# Patient Record
Sex: Female | Born: 1935 | Race: White | Hispanic: No | Smoking: Never smoker
Health system: Southern US, Community
[De-identification: ages and names within clinical notes are randomized; demographics above are authoritative.]

## PROBLEM LIST (undated history)

## (undated) DIAGNOSIS — I10 Essential (primary) hypertension: Secondary | ICD-10-CM

## (undated) HISTORY — PX: BACK SURGERY: SHX140

---

## 1998-02-26 ENCOUNTER — Other Ambulatory Visit: Admission: RE | Admit: 1998-02-26 | Discharge: 1998-02-26 | Payer: Self-pay | Admitting: Internal Medicine

## 1998-03-08 ENCOUNTER — Other Ambulatory Visit: Admission: RE | Admit: 1998-03-08 | Discharge: 1998-03-08 | Payer: Self-pay | Admitting: Obstetrics and Gynecology

## 2000-05-04 ENCOUNTER — Encounter: Admission: RE | Admit: 2000-05-04 | Discharge: 2000-05-04 | Payer: Self-pay | Admitting: Internal Medicine

## 2000-05-04 ENCOUNTER — Encounter: Payer: Self-pay | Admitting: Internal Medicine

## 2000-07-01 ENCOUNTER — Encounter (INDEPENDENT_AMBULATORY_CARE_PROVIDER_SITE_OTHER): Payer: Self-pay | Admitting: Specialist

## 2000-07-01 ENCOUNTER — Ambulatory Visit (HOSPITAL_COMMUNITY): Admission: RE | Admit: 2000-07-01 | Discharge: 2000-07-01 | Payer: Self-pay | Admitting: Gastroenterology

## 2001-04-08 ENCOUNTER — Encounter: Payer: Self-pay | Admitting: Internal Medicine

## 2001-04-08 ENCOUNTER — Encounter: Admission: RE | Admit: 2001-04-08 | Discharge: 2001-04-08 | Payer: Self-pay | Admitting: Internal Medicine

## 2001-05-26 ENCOUNTER — Encounter: Payer: Self-pay | Admitting: Internal Medicine

## 2001-05-26 ENCOUNTER — Encounter: Admission: RE | Admit: 2001-05-26 | Discharge: 2001-05-26 | Payer: Self-pay | Admitting: Internal Medicine

## 2002-06-21 ENCOUNTER — Encounter: Payer: Self-pay | Admitting: Internal Medicine

## 2002-06-21 ENCOUNTER — Encounter: Admission: RE | Admit: 2002-06-21 | Discharge: 2002-06-21 | Payer: Self-pay | Admitting: Internal Medicine

## 2003-07-04 ENCOUNTER — Encounter: Admission: RE | Admit: 2003-07-04 | Discharge: 2003-07-04 | Payer: Self-pay | Admitting: Internal Medicine

## 2003-07-04 ENCOUNTER — Encounter: Payer: Self-pay | Admitting: Internal Medicine

## 2004-07-23 ENCOUNTER — Encounter: Admission: RE | Admit: 2004-07-23 | Discharge: 2004-07-23 | Payer: Self-pay | Admitting: Internal Medicine

## 2005-09-08 ENCOUNTER — Encounter: Admission: RE | Admit: 2005-09-08 | Discharge: 2005-09-08 | Payer: Self-pay | Admitting: Internal Medicine

## 2007-08-26 ENCOUNTER — Observation Stay (HOSPITAL_COMMUNITY): Admission: EM | Admit: 2007-08-26 | Discharge: 2007-08-26 | Payer: Self-pay | Admitting: Emergency Medicine

## 2011-02-25 NOTE — H&P (Signed)
Mandy Smith, Mandy Smith                ACCOUNT NO.:  [id number]   MEDICAL RECORD NO.:  [id number]          PATIENT TYPE:  EMS   LOCATION:  MAJO                         FACILITY:  [facility]   PHYSICIAN:  Corinna L. Lendell Caprice, MDDATE OF BIRTH:  [birth year]   DATE OF ADMISSION:  08/25/2007  DATE OF DISCHARGE:                              HISTORY & PHYSICAL   CHIEF COMPLAINT:  Swimmy headed.   HISTORY OF PRESENT ILLNESS:  Mandy Smith is a pleasant 77 year old  white female, patient of Dr. Kirby Funk, who presents to the emergency  room feeling dizzy.  Apparently she was standing at church when she  began to feel swimmy headed.  She felt herself going down and some  church goers assisted her to a seated position and called 911.  This  episode lasted a few minutes.  She denies any fevers, chills, night  sweats. She does report urinary frequency recently.  She has had no  dysuria.  She had no chest pain or palpitations.   PAST MEDICAL HISTORY:  1. Hypertension.  2. Osteoporosis.   MEDICATIONS:  1. Actonel.  2. Two blood pressure medications.   ALLERGIES:  No known drug allergies.   SOCIAL HISTORY:  The patient is very active and was out in her garden  today.  She does not smoke or drink.   FAMILY HISTORY:  Noncontributory.   REVIEW OF SYSTEMS:  As above.  Otherwise negative.   PHYSICAL EXAMINATION:  VITAL SIGNS:  Temperature 97.7, blood pressure  168/61, pulse 112, respirations 18, oxygen saturation 97% on room air.  GENERAL:  The patient is an elderly, white female in no acute distress.  HEENT:  Normocephalic, atraumatic.  Pupils are equal, round and reactive  to light.  Sclerae nonicteric.  Moist mucous membranes.  NECK:  Supple.  No carotid bruits.  LUNGS:  Clear to auscultation bilaterally without wheezes, rales or  rhonchi.  CARDIOVASCULAR:  Regular rate and rhythm.  No murmurs, gallops or rubs.  ABDOMEN:  Soft.  Nondistended. Slight suprapubic tenderness.  GU/RECTAL:   Deferred.  EXTREMITIES:  No clubbing, cyanosis or edema.  SKIN:  No rash.  PSYCHIATRIC:  Normal affect.  NEUROLOGIC: Alert and oriented.  Cranial nerves and sensory motor exam  are intact.   LABORATORY DATA:  Normal CBC.  Basic metabolic panel unremarkable.  Point-of-care enzymes normal.  Urinalysis is significant for positive  nitrites, trace leukocyte esterase, 3-6 white cells, many bacteria.  Chest x-ray negative.   ASSESSMENT/PLAN:  1. Presyncope.  Suspect secondary to urinary tract infection.  The      patient will be placed on 23-hour observation on telemetry.  I will      check another set of cardiac enzymes.  She will get Cipro and IV      fluids.  2. Hypertension.  She does not know her blood pressure medications.  I      will give clonidine p.r.n. for now.  3. Osteoporosis.  4. Left bundle branch block.  I have no old EKG for comparison.      Corinna L. Lendell Caprice, MD  Electronically Signed  CLS/MEDQ  D:  08/26/2007  T:  08/26/2007  Job:  [id number]   cc:   Thora Lance, M.D.

## 2011-02-28 NOTE — Discharge Summary (Signed)
NAMERAIZEL, WESOLOWSKI                ACCOUNT NO.:  [id number]   MEDICAL RECORD NO.:  [id number]          PATIENT TYPE:  OBV   LOCATION:  6524                         FACILITY:  [facility]   PHYSICIAN:  Thora Lance, M.D.  DATE OF BIRTH:  April 09, 1923   DATE OF ADMISSION:  08/25/2007  DATE OF DISCHARGE:  08/26/2007                               DISCHARGE SUMMARY   REASON FOR ADMISSION:  The patient was admitted with an episode of  presyncope.   HISTORY AND PHYSICAL:  Details are documented in the H and P.   HOSPITAL COURSE:  The patient was treated with Cipro for UTI.  She was  given IV fluids.  On the next day after admission she ambulated.  She  was discharged in good condition.   DISCHARGE DIAGNOSES:  1. Presyncope.  2. Urinary tract infection.   DISCHARGE MEDICATIONS:  1. Cipro 500 mg b.i.d. 1 week.  2. Actonel 35 mg q week.  3. Lisinopril/HCT 20.12.5 mg daily.  4. Amlodipine daily.   DISPOSITION:  Discharged home.   FOLLOWUP:  Follow up in 1-2 weeks with Dr. Valentina Lucks.           ______________________________  Thora Lance, M.D.     JJG/MEDQ  D:  10/06/2007  T:  10/06/2007  Job:  [id number]

## 2011-07-22 LAB — URINALYSIS, ROUTINE W REFLEX MICROSCOPIC
Nitrite: POSITIVE — AB
Specific Gravity, Urine: 1.01
Urobilinogen, UA: 0.2
pH: 7.5

## 2011-07-22 LAB — CBC
HCT: 34.6 — ABNORMAL LOW
Hemoglobin: 11.9 — ABNORMAL LOW
MCHC: 34.5
RBC: 3.86 — ABNORMAL LOW
RDW: 12.3

## 2011-07-22 LAB — I-STAT 8, (EC8 V) (CONVERTED LAB)
BUN: 22
Chloride: 109
Glucose, Bld: 121 — ABNORMAL HIGH
Hemoglobin: 13.3
Operator id: 265201
Sodium: 142
pCO2, Ven: 44.9 — ABNORMAL LOW

## 2011-07-22 LAB — DIFFERENTIAL
Basophils Absolute: 0
Basophils Relative: 0
Eosinophils Relative: 3
Monocytes Absolute: 0.4
Monocytes Relative: 8
Neutro Abs: 2.6

## 2011-07-22 LAB — CARDIAC PANEL(CRET KIN+CKTOT+MB+TROPI)
CK, MB: 1.7
Total CK: 67

## 2011-07-22 LAB — URINE MICROSCOPIC-ADD ON

## 2011-07-22 LAB — POCT CARDIAC MARKERS
Operator id: 265201
Troponin i, poc: <0.05

## 2012-08-28 ENCOUNTER — Emergency Department (HOSPITAL_COMMUNITY)
Admission: EM | Admit: 2012-08-28 | Discharge: 2012-08-28 | Disposition: A | Discharge status: Home / Self Care | Payer: 59 | Source: Home / Self Care | Attending: Emergency Medicine | Admitting: Emergency Medicine

## 2012-08-28 ENCOUNTER — Encounter (HOSPITAL_COMMUNITY): Payer: Self-pay | Admitting: Emergency Medicine

## 2012-08-28 DIAGNOSIS — K219 Gastro-esophageal reflux disease without esophagitis: Secondary | ICD-10-CM

## 2012-08-28 DIAGNOSIS — R112 Nausea with vomiting, unspecified: Secondary | ICD-10-CM

## 2012-08-28 HISTORY — DX: Essential (primary) hypertension: I10

## 2012-08-28 LAB — POCT URINALYSIS DIP (DEVICE)
Glucose, UA: NEGATIVE mg/dL
Ketones, ur: NEGATIVE mg/dL
Specific Gravity, Urine: 1.02 (ref 1.005–1.030)
Urobilinogen, UA: 0.2 mg/dL (ref 0.0–1.0)

## 2012-08-28 LAB — POCT I-STAT, CHEM 8
BUN: 24 mg/dL — ABNORMAL HIGH (ref 6–23)
Calcium, Ion: 1.35 mmol/L — ABNORMAL HIGH (ref 1.13–1.30)
Chloride: 95 mEq/L — ABNORMAL LOW (ref 96–112)
HCT: 40 % (ref 36.0–46.0)
Potassium: 4 mEq/L (ref 3.5–5.1)
Sodium: 135 mEq/L (ref 135–145)

## 2012-08-28 MED ORDER — PANTOPRAZOLE SODIUM 40 MG PO TBEC: 40.0000 mg | DELAYED_RELEASE_TABLET | Freq: Every day | ORAL | Status: DC

## 2012-08-28 MED ORDER — ONDANSETRON HCL 4 MG PO TABS: 4.0000 mg | ORAL_TABLET | Freq: Four times a day (QID) | ORAL | Status: DC

## 2012-08-28 MED ORDER — GI COCKTAIL ~~LOC~~
30.0000 mL | Freq: Once | ORAL | Status: AC
  Administered 2012-08-28: 30 mL via ORAL

## 2012-08-28 MED ORDER — GI COCKTAIL ~~LOC~~
ORAL | Status: AC
  Filled 2012-08-28: qty 30

## 2012-08-28 NOTE — ED Provider Notes (Signed)
History     CSN: [id number]  Arrival date & time 08/28/12  1037   None     Chief Complaint  Patient presents with  . Emesis    (Consider location/radiation/quality/duration/timing/severity/associated sxs/prior treatment) Patient is a 78 y.o. female presenting with vomiting. The history is provided by the patient and a relative.  Emesis  This is a recurrent problem. The current episode started more than 1 week ago (6-8 months ago). Episode frequency: 1-2 times daily after meals, worse at night. The problem has been gradually worsening. The emesis has an appearance of stomach contents (denies blood or bilious material). There has been no fever. Associated symptoms include abdominal pain and cough. Pertinent negatives include no arthralgias, no chills, no diarrhea, no fever, no headaches, no myalgias, no sweats and no URI. Risk factors: pmh of gerd.    Past Medical History  Diagnosis Date  . Hypertension     Past Surgical History  Procedure Date  . Back surgery     No family history on file.  History  Substance Use Topics  . Smoking status: Never Smoker   . Smokeless tobacco: Not on file  . Alcohol Use: No    OB History    Grav Para Term Preterm Abortions TAB SAB Ect Mult Living                  Review of Systems  Constitutional: Negative for fever and chills.  Respiratory: Positive for cough.   Cardiovascular: Positive for chest pain.  Gastrointestinal: Positive for nausea, vomiting and abdominal pain. Negative for diarrhea.  Musculoskeletal: Negative for myalgias and arthralgias.  Neurological: Negative for headaches.  All other systems reviewed and are negative.    Allergies  Review of patient's allergies indicates no known allergies.  Home Medications   Current Outpatient Rx  Name  Route  Sig  Dispense  Refill  . VITAMIN D PO   Oral   Take by mouth.         . FELODIPINE ER 5 MG PO TB24   Oral   Take 5 mg by mouth daily.         Marland Kitchen LISINOPRIL  2.5 MG PO TABS   Oral   Take 2.5 mg by mouth daily.         Marland Kitchen LISINOPRIL-HYDROCHLOROTHIAZIDE 10-12.5 MG PO TABS   Oral   Take 1 tablet by mouth daily.         Marland Kitchen ONDANSETRON HCL 4 MG PO TABS   Oral   Take 1 tablet (4 mg total) by mouth every 6 (six) hours.   30 tablet   0   . PANTOPRAZOLE SODIUM 40 MG PO TBEC   Oral   Take 1 tablet (40 mg total) by mouth daily.   30 tablet   2     BP 109/47  Pulse 72  Temp 98.1 F (36.7 C) (Oral)  Resp 22  SpO2 97%  Physical Exam  Nursing note and vitals reviewed. Constitutional: She is oriented to person, place, and time. Vital signs are normal. She appears well-developed and well-nourished. She is active and cooperative.  HENT:  Head: Normocephalic.  Mouth/Throat: Oropharynx is clear and moist. No oropharyngeal exudate.  Eyes: Conjunctivae normal are normal. Pupils are equal, round, and reactive to light. No scleral icterus.  Neck: Trachea normal and normal range of motion. Neck supple.  Cardiovascular: Normal rate, regular rhythm, normal heart sounds and intact distal pulses.   Pulmonary/Chest: Effort normal and breath sounds  normal.  Abdominal: Soft. Bowel sounds are normal. There is no tenderness. There is no guarding.  Lymphadenopathy:    She has no cervical adenopathy.  Neurological: She is alert and oriented to person, place, and time. No cranial nerve deficit or sensory deficit.  Skin: Skin is warm and dry.  Psychiatric: She has a normal mood and affect. Her speech is normal and behavior is normal. Judgment and thought content normal. Cognition and memory are normal.    ED Course  Procedures (including critical care time)  Labs Reviewed  POCT I-STAT, CHEM 8 - Abnormal; Notable for the following:    Chloride 95 (*)     BUN 24 (*)     Creatinine, Ser 1.40 (*)     Glucose, Bld 151 (*)     Calcium, Ion 1.35 (*)     All other components within normal limits  POCT URINALYSIS DIP (DEVICE) - Abnormal; Notable for the  following:    Bilirubin Urine SMALL (*)     Hgb urine dipstick TRACE (*)     Protein, ur 100 (*)     Leukocytes, UA SMALL (*)  Biochemical Testing Only. Please order routine urinalysis from main lab if confirmatory testing is needed.   All other components within normal limits   No results found.   1. GERD (gastroesophageal reflux disease)   2. Nausea & vomiting       MDM  Increase fluids, medications as prescribed.  Follow up with GI this week for further evaluation. rtc as needed or if symptoms do not improved.      Johnsie Kindred, NP 08/28/12 1429

## 2012-08-28 NOTE — ED Provider Notes (Signed)
Medical screening examination/treatment/procedure(s) were performed by non-physician practitioner and as supervising physician I was immediately available for consultation/collaboration. I personally discussed this case with Mandy Smith. The patient appears to be stable does not appear to have any active GI bleeding. We did emphasize to her the importance of returning again if they should appear to get worse.  Leslee Home, M.D.   Reuben Likes, MD 08/28/12 2204

## 2012-08-28 NOTE — ED Notes (Signed)
Instructed to undress for exam, given gown.  Patient's sister and niece with her in treatment room

## 2012-08-28 NOTE — ED Notes (Signed)
Reports vomiting intermittent since January 2013.  Reports intermittent episodes of vomiting, may go for a a week or two without vomiting, then cycle starts again.  Patient has seen her pcp and another physician in the office.  Reports she has taken a purple pill for reflux and feels this helped.  No diarrhea.  Pain in epigastric area, center-same pain associated with vomiting episodes.  Last bm was yesterday-normal for patient.  Reports issues with constipation sometimes

## 2012-11-17 ENCOUNTER — Other Ambulatory Visit: Payer: Self-pay | Admitting: Gastroenterology

## 2013-07-08 ENCOUNTER — Emergency Department (HOSPITAL_COMMUNITY)
Admission: EM | Admit: 2013-07-08 | Discharge: 2013-07-08 | Disposition: A | Discharge status: Home / Self Care | Payer: 59 | Source: Home / Self Care | Attending: Emergency Medicine | Admitting: Emergency Medicine

## 2013-07-08 ENCOUNTER — Encounter (HOSPITAL_COMMUNITY): Payer: Self-pay | Admitting: *Deleted

## 2013-07-08 ENCOUNTER — Emergency Department (INDEPENDENT_AMBULATORY_CARE_PROVIDER_SITE_OTHER): Payer: 59

## 2013-07-08 DIAGNOSIS — L0291 Cutaneous abscess, unspecified: Secondary | ICD-10-CM

## 2013-07-08 DIAGNOSIS — S5001XA Contusion of right elbow, initial encounter: Secondary | ICD-10-CM

## 2013-07-08 DIAGNOSIS — IMO0002 Reserved for concepts with insufficient information to code with codable children: Secondary | ICD-10-CM

## 2013-07-08 DIAGNOSIS — Z23 Encounter for immunization: Secondary | ICD-10-CM

## 2013-07-08 DIAGNOSIS — T148XXA Other injury of unspecified body region, initial encounter: Secondary | ICD-10-CM

## 2013-07-08 DIAGNOSIS — S5000XA Contusion of unspecified elbow, initial encounter: Secondary | ICD-10-CM

## 2013-07-08 DIAGNOSIS — W19XXXA Unspecified fall, initial encounter: Secondary | ICD-10-CM

## 2013-07-08 DIAGNOSIS — L0390 Cellulitis, unspecified: Secondary | ICD-10-CM

## 2013-07-08 MED ORDER — MUPIROCIN 2 % EX OINT: TOPICAL_OINTMENT | Freq: Three times a day (TID) | CUTANEOUS | Status: DC

## 2013-07-08 MED ORDER — TETANUS-DIPHTH-ACELL PERTUSSIS 5-2.5-18.5 LF-MCG/0.5 IM SUSP
0.5000 mL | Freq: Once | INTRAMUSCULAR | Status: AC
  Administered 2013-07-08: 0.5 mL via INTRAMUSCULAR

## 2013-07-08 MED ORDER — CEPHALEXIN 500 MG PO CAPS: 500.0000 mg | ORAL_CAPSULE | Freq: Three times a day (TID) | ORAL | Status: DC

## 2013-07-08 MED ORDER — TETANUS-DIPHTH-ACELL PERTUSSIS 5-2.5-18.5 LF-MCG/0.5 IM SUSP
INTRAMUSCULAR | Status: AC
  Filled 2013-07-08: qty 0.5

## 2013-07-08 NOTE — ED Notes (Signed)
Pt  Reports  She  Felled  And  Injured  Her   r  Elbow    6  Days  Ago  She  Has  Some  Redness  And    Swelling to the  Affected  Elbow            She  Did  Not  Black  Out                She  Is sitting  Upright  On  Exam         Table              Awoke        And  Alert         And  Oriented              She states  Her feet  Got tangled  p  On  Her porch       She  States  She  Struck  Her  r  Jaw  As  Well  But          It looks  Good

## 2013-07-08 NOTE — ED Provider Notes (Signed)
Chief Complaint:   Chief Complaint  Patient presents with  . Arm Injury    History of Present Illness:   Mandy Smith is an 79 year old female who tripped going up some steps a week ago. She was struck in the skin her right elbow. She's not had much pain in the elbow ever since then and she is able to move it through full range of motion without any pain. The abrasion on the elbow has been getting red and tender the past couple days. It's not been draining any pus. She denies any fever or chills. She denies any head injury and she's not taking any anticoagulants. She denies any neck or chest pain, abdominal pain, or lower extremity pain. Denies any pain in her shoulder or wrist or hand.  Review of Systems:  Other than noted above, the patient denies any of the following symptoms: Systemic:  No fevers, chills, sweats, or aches.  No fatigue or tiredness. Musculoskeletal:  No joint pain, arthritis, bursitis, swelling, back pain, or neck pain. Neurological:  No muscular weakness, paresthesias, headache, or trouble with speech or coordination.  No dizziness.  PMFSH:  Past medical history, family history, social history, meds, and allergies were reviewed.  She has high blood pressure and takes 2 blood pressure pills.  Physical Exam:   Vital signs:  BP 150/56  Pulse 78  Temp(Src) 98.1 F (36.7 C) (Oral)  Resp 16  SpO2 100% Gen:  Alert and oriented times 3.  In no distress. Musculoskeletal: There is an abrasion on the right elbow with some surrounding erythema but no purulent drainage. This is mildly tender to palpation. The elbow itself has a full range of motion.  Otherwise, all joints had a full a ROM with no swelling, bruising or deformity.  No edema, pulses full. Extremities were warm and pink.  Capillary refill was brisk.  Skin:  Clear, warm and dry.  No rash. Neuro:  Alert and oriented times 3.  Muscle strength was normal.  Sensation was intact to light touch.   Radiology:  Dg Elbow  Complete Right  07/08/2013   CLINICAL DATA:  Fall 1 week ago  EXAM: RIGHT ELBOW - COMPLETE 3+ VIEW  COMPARISON:  None.  FINDINGS: Four views of right elbow submitted. No acute fracture or subluxation. No posterior fat pad sign. Soft tissue irregularity noted posterior elbow region.  IMPRESSION: No acute fracture or subluxation. No posterior fat pad sign.   Electronically Signed   By: Natasha Mead   On: 07/08/2013 13:55   I reviewed the images independently and personally and concur with the radiologist's findings.  Course in Urgent Care Center:   Antibiotic ointment was applied and a sterile dressing. She was given a Tdap vaccine.  Assessment:  The primary encounter diagnosis was Abrasion. Diagnoses of Cellulitis, Fall, initial encounter, and Contusion of right elbow, initial encounter were also pertinent to this visit.  This shows mild evidence of infection. It's not for his severe right now. We'll start on cephalexin and given Bactroban ointment. Return if it appears to be getting any worse.  Plan:   1.  Meds:  The following meds were prescribed:   Discharge Medication List as of 07/08/2013  2:20 PM    START taking these medications   Details  cephALEXin (KEFLEX) 500 MG capsule Take 1 capsule (500 mg total) by mouth 3 (three) times daily., Starting 07/08/2013, Until Discontinued, Normal    mupirocin ointment (BACTROBAN) 2 % Apply topically 3 (three) times daily.,  Starting 07/08/2013, Until Discontinued, Normal        2.  Patient Education/Counseling:  The patient was given appropriate handouts, self care instructions, and instructed in symptomatic relief, including rest and activity, elevation, and application of heat.  Was given wound care instructions.  3.  Follow up:  The patient was told to follow up if no better in 3 to 4 days, if becoming worse in any way, and given some red flag symptoms such as spreading of erythema, worsening pain, or fever which would prompt immediate return.  Follow  up here if necessary.     Reuben Likes, MD 07/08/13 [id number]

## 2014-02-06 ENCOUNTER — Emergency Department (HOSPITAL_COMMUNITY)
Admission: EM | Admit: 2014-02-06 | Discharge: 2014-02-06 | Disposition: A | Discharge status: Home / Self Care | Payer: 59 | Source: Home / Self Care

## 2014-02-06 ENCOUNTER — Encounter (HOSPITAL_COMMUNITY): Payer: Self-pay | Admitting: Emergency Medicine

## 2014-02-06 ENCOUNTER — Emergency Department (INDEPENDENT_AMBULATORY_CARE_PROVIDER_SITE_OTHER): Payer: 59

## 2014-02-06 DIAGNOSIS — S61209A Unspecified open wound of unspecified finger without damage to nail, initial encounter: Secondary | ICD-10-CM

## 2014-02-06 DIAGNOSIS — S63259A Unspecified dislocation of unspecified finger, initial encounter: Principal | ICD-10-CM

## 2014-02-06 DIAGNOSIS — Y92009 Unspecified place in unspecified non-institutional (private) residence as the place of occurrence of the external cause: Secondary | ICD-10-CM

## 2014-02-06 DIAGNOSIS — W19XXXA Unspecified fall, initial encounter: Secondary | ICD-10-CM

## 2014-02-06 DIAGNOSIS — IMO0002 Reserved for concepts with insufficient information to code with codable children: Secondary | ICD-10-CM

## 2014-02-06 DIAGNOSIS — S80211A Abrasion, right knee, initial encounter: Secondary | ICD-10-CM

## 2014-02-06 MED ORDER — HYDROCODONE-ACETAMINOPHEN 5-325 MG PO TABS: 1.0000 | ORAL_TABLET | ORAL | Status: DC | PRN

## 2014-02-06 NOTE — Discharge Instructions (Signed)
Dislocation or Subluxation Dislocation of a joint occurs when ends of two or more adjacent bones no longer touch each other. A subluxation is a minor form of a dislocation, in which two or more adjacent bones are no longer properly aligned. The most common joints susceptible to a dislocation are the shoulder, kneecap, and fingers.  SYMPTOMS   Sudden pain at the time of injury.  Noticeable deformity in the area of the joint.  Limited range of motion. CAUSES   Usually a traumatic injury that stretches or tears ligaments that surround a joint and hold the bones together.  Condition present at birth (congenital) in which the joint surfaces are shallow or abnormally formed.  Joint disease such as arthritis or other diseases of ligaments and tissues around a joint. RISK INCREASES WITH:  Repeated injury to a joint.  Previous dislocation of a joint.  Contact sports (football, rugby, hockey, lacrosse) or sports that require repetitive overhead arm motion (throwing, swimming, volleyball).  Rheumatoid arthritis.  Congenital joint condition. PREVENTION  Warm up and stretch properly before activity.  Maintain physical fitness:  Joint flexibility.  Muscle strength and endurance.  Cardiovascular fitness.  Wear proper protective equipment and ensure correct fit.  Learn and use proper technique. PROGNOSIS  This condition is usually curable with prompt treatment. After the dislocation has been put back in place, the joint may require immobilization with a cast, splint, or sling for 2 to 6 weeks, often followed by strength and stretching exercises that may be performed at home or with a therapist. RELATED COMPLICATIONS   Damage to nearby nerves or major blood vessels, causing numbness, coldness, or paleness.  Recurrent injury to the joint.  Arthritis of affected joint.  Fracture of joint. TREATMENT Treatment initially involves realigning the bones (reduction) of the joint.  Reductions should only be performed by someone who is trained in the procedure. After the joint is reduced, medicine and ice should be used to reduce pain and inflammation. The joint may be immobilized to allow for the muscles and ligaments to heal. If a joint is subjected to recurrent dislocations, surgery may be necessary to tighten or replace injured structures. After surgery, stretching and strengthening exercises may be required. These may be performed at home or with a therapist. MEDICATION   Patients may require medicine to help them relax (sedative) or muscle relaxants in order to reduce the joint.  If pain medicine is necessary, nonsteroidal anti-inflammatory medicines, such as aspirin and ibuprofen, or other minor pain relievers, such as acetaminophen, are often recommended.  Do not take pain medicine for 7 days before surgery.  Prescription pain relievers may be necessary. Use only as directed and only as much as you need. SEEK MEDICAL CARE IF:   Symptoms get worse or do not improve despite treatment.  You have difficulty moving a joint after injury.  Any extremity becomes numb, pale, or cool after injury. This is an emergency.  Dislocations or subluxations occur repeatedly. Document Released: 09/29/2005 Document Revised: 12/22/2011 Document Reviewed: 01/11/2009 [facility] Patient Information 2014 [place], [place].  RICE: Routine Care for Injuries The routine care of many injuries includes Rest, Ice, Compression, and Elevation (RICE). HOME CARE INSTRUCTIONS  Rest is needed to allow your body to heal. Routine activities can usually be resumed when comfortable. Injured tendons and bones can take up to 6 weeks to heal. Tendons are the cord-like structures that attach muscle to bone.  Ice following an injury helps keep the swelling down and reduces pain.  Put  ice in a plastic bag.  Place a towel between your skin and the bag.  Leave the ice on for 15-20 minutes, 03-04 times a  day. Do this while awake, for the first 24 to 48 hours. After that, continue as directed by your caregiver.  Compression helps keep swelling down. It also gives support and helps with discomfort. If an elastic bandage has been applied, it should be removed and reapplied every 3 to 4 hours. It should not be applied tightly, but firmly enough to keep swelling down. Watch fingers or toes for swelling, bluish discoloration, coldness, numbness, or excessive pain. If any of these problems occur, remove the bandage and reapply loosely. Contact your caregiver if these problems continue.  Elevation helps reduce swelling and decreases pain. With extremities, such as the arms, hands, legs, and feet, the injured area should be placed near or above the level of the heart, if possible. SEEK IMMEDIATE MEDICAL CARE IF:  You have persistent pain and swelling.  You develop redness, numbness, or unexpected weakness.  Your symptoms are getting worse rather than improving after several days. These symptoms may indicate that further evaluation or further X-rays are needed. Sometimes, X-rays may not show a small broken bone (fracture) until 1 week or 10 days later. Make a follow-up appointment with your caregiver. Ask when your X-ray results will be ready. Make sure you get your X-ray results. Document Released: 01/11/2001 Document Revised: 12/22/2011 Document Reviewed: 02/28/2011 [facility] Patient Information 2014 [place], [place].

## 2014-02-06 NOTE — ED Provider Notes (Signed)
Medical screening examination/treatment/procedure(s) were performed by resident physician or non-physician practitioner and as supervising physician I was immediately available for consultation/collaboration.   Nathanyel Defenbaugh DOUGLAS MD.   Chelesea Weiand D Ludie Hudon, MD 02/06/14 2107 

## 2014-02-06 NOTE — ED Provider Notes (Signed)
CSN: [id number]     Arrival date & time 02/06/14  1522 History   None    Chief Complaint  Patient presents with  . Hand Injury    injury to right ring finger   (Consider location/radiation/quality/duration/timing/severity/associated sxs/prior Treatment)  HPI  The patient is a pleasant 80 year old female presenting today following a fall in her carport at home approximately an hour prior to arrival. The patient states that she was holding a flower basket in her right hand when she fell.  The patient states her finger is uncomfortable but it is "bearable". States that the abrasion to her right knee however "stings".  Past Medical History  Diagnosis Date  . Hypertension    Past Surgical History  Procedure Laterality Date  . Back surgery     History reviewed. No pertinent family history. History  Substance Use Topics  . Smoking status: Never Smoker   . Smokeless tobacco: Not on file  . Alcohol Use: No   OB History   Grav Para Term Preterm Abortions TAB SAB Ect Mult Living                 Review of Systems  Constitutional: Negative.   HENT: Negative.   Eyes: Negative.   Respiratory: Negative.   Cardiovascular: Negative for chest pain, palpitations and leg swelling.       History of hypertension.  Gastrointestinal: Negative.   Endocrine: Negative.   Genitourinary: Negative.   Musculoskeletal: Positive for joint swelling.       Finger deformity.  Skin: Positive for wound. Negative for color change, pallor and rash.       Patient has an abrasion with some swelling to the right knee. In addition, there is a break in the skin at the right PIP joint medial side.  Allergic/Immunologic: Negative.   Neurological: Negative for dizziness, syncope, weakness, numbness and headaches.  Hematological: Negative.   Psychiatric/Behavioral: Negative.     Allergies  Review of patient's allergies indicates no known allergies.  Home Medications   Prior to Admission medications     Medication Sig Start Date End Date Taking? Authorizing Provider  Cholecalciferol (VITAMIN D PO) Take by mouth.   Yes Historical Provider, MD  felodipine (PLENDIL) 5 MG 24 hr tablet Take 5 mg by mouth daily.   Yes Historical Provider, MD  lisinopril (PRINIVIL,ZESTRIL) 2.5 MG tablet Take 2.5 mg by mouth daily.   Yes Historical Provider, MD  pantoprazole (PROTONIX) 40 MG tablet Take 1 tablet (40 mg total) by mouth daily. 08/28/12  Yes Johnsie Kindredarmen L Chatten, NP  cephALEXin (KEFLEX) 500 MG capsule Take 1 capsule (500 mg total) by mouth 3 (three) times daily. 07/08/13   Reuben Likesavid C Keller, MD  lisinopril-hydrochlorothiazide (PRINZIDE,ZESTORETIC) 10-12.5 MG per tablet Take 1 tablet by mouth daily.    Historical Provider, MD  mupirocin ointment (BACTROBAN) 2 % Apply topically 3 (three) times daily. 07/08/13   Reuben Likesavid C Keller, MD  ondansetron (ZOFRAN) 4 MG tablet Take 1 tablet (4 mg total) by mouth every 6 (six) hours. 08/28/12   Johnsie Kindredarmen L Chatten, NP   BP 167/69  Pulse 90  Temp(Src) 97.9 F (36.6 C) (Oral)  Resp 20  SpO2 96%  Physical Exam  Nursing note and vitals reviewed. Constitutional: She is oriented to person, place, and time. She appears well-developed and well-nourished. No distress.  Cardiovascular: Normal rate, regular rhythm, normal heart sounds and intact distal pulses.  Exam reveals no gallop and no friction rub.   No murmur heard. Pulmonary/Chest:  Effort normal and breath sounds normal. No respiratory distress. She has no wheezes. She has no rales. She exhibits no tenderness.  Musculoskeletal:       Right knee: She exhibits normal range of motion, no swelling, no effusion, no ecchymosis, no laceration, no erythema, normal patellar mobility, no bony tenderness, normal meniscus and no MCL laxity. Tenderness found. No medial joint line, no lateral joint line, no MCL, no LCL and no patellar tendon tenderness noted.       Hands:      Legs: Patient has an obvious dislocation of the right ring finger  at the PIP joint the finger is grossly deformed.  Color movement and sensation is intact to the distal tip of all digits.  Patient strength is 5/5 all 4 extremities.   Following reduction patient has complete flexion/extension and strength of the DIP and PIP to all digits R hand.  CMS and intact.  Cap refill is less than 3 seconds.  Neurological: She is alert and oriented to person, place, and time.  Skin: Skin is warm and dry. She is not diaphoretic.    ED Course  NERVE BLOCK Date/Time: 02/06/2014 5:12 PM Performed by: Weber CooksOSSI, CATHERINE Authorized by: Weber CooksOSSI, CATHERINE Consent: Verbal consent obtained. Consent given by: patient Patient identity confirmed: verbally with patient Indications: pain relief and dislocation Nerve block body site: Ring finger PIP joint. Laterality: right Patient sedated: no Local anesthetic: lidocaine 2% without epinephrine Anesthetic total: 2 ml Outcome: pain improved Patient tolerance: Patient tolerated the procedure well with no immediate complications.   (including critical care time) Labs Review Labs Reviewed - No data to display  Imaging Review Dg Hand Complete Right  02/06/2014   CLINICAL DATA:  RIGHT hand pain and ring finger deformity post fall today  EXAM: RIGHT HAND - COMPLETE 3+ VIEW  COMPARISON:  None  FINDINGS: Bones appear demineralized.  Ulnar dislocation of PIP joint RIGHT ring finger.  Remain joint spaces preserved.  Degenerative changes of scattered interphalangeal joints RIGHT hand.  No acute fracture, additional dislocation or bone destruction.  IMPRESSION: PIP dislocation RIGHT ring finger.  Osseous demineralization.   Electronically Signed   By: Ulyses SouthwardMark  Boles M.D.   On: 02/06/2014 16:34   Dr. Artis FlockKindl consulted in plan of care.  He supervised digital block and reduced right ring finger at PIP joint.  The patient tolerated procedure well.  MDM   1. Open finger dislocation   2. Abrasion of knee, right    Meds ordered this encounter    Medications  . HYDROcodone-acetaminophen (NORCO/VICODIN) 5-325 MG per tablet    Sig: Take 1-2 tablets by mouth every 4 (four) hours as needed.    Dispense:  15 tablet    Refill:  0   The patient verbalizes understanding and agrees to plan of care.       Weber Cooksatherine Rossi, NP 02/06/14 1839

## 2014-02-06 NOTE — ED Notes (Signed)
Reports falling at home in car port injuring right index finger.  Finger is displaced.    Right knee bruising.

## 2014-07-13 ENCOUNTER — Other Ambulatory Visit (HOSPITAL_COMMUNITY): Payer: Self-pay | Admitting: Internal Medicine

## 2014-07-13 DIAGNOSIS — E213 Hyperparathyroidism, unspecified: Secondary | ICD-10-CM

## 2014-07-26 ENCOUNTER — Encounter (HOSPITAL_COMMUNITY): Payer: 59

## 2014-07-26 ENCOUNTER — Ambulatory Visit (HOSPITAL_COMMUNITY): Payer: 59

## 2015-09-25 ENCOUNTER — Ambulatory Visit
Admission: RE | Admit: 2015-09-25 | Discharge: 2015-09-25 | Disposition: A | Discharge status: Home / Self Care | Payer: Self-pay | Source: Ambulatory Visit | Attending: Internal Medicine | Admitting: Internal Medicine

## 2015-09-25 ENCOUNTER — Other Ambulatory Visit: Payer: Self-pay | Admitting: Internal Medicine

## 2015-09-25 DIAGNOSIS — M25552 Pain in left hip: Secondary | ICD-10-CM

## 2016-05-31 IMAGING — CR DG HIP (WITH OR WITHOUT PELVIS) 2-3V*L*
2 series · 2 of 2 positions shown · non-contrast
Comparison: None.

CLINICAL DATA: Left posterior hip pain for several months. No known
injury.

EXAM:
DG HIP (WITH OR WITHOUT PELVIS) 2-3V LEFT

[w pelvis]
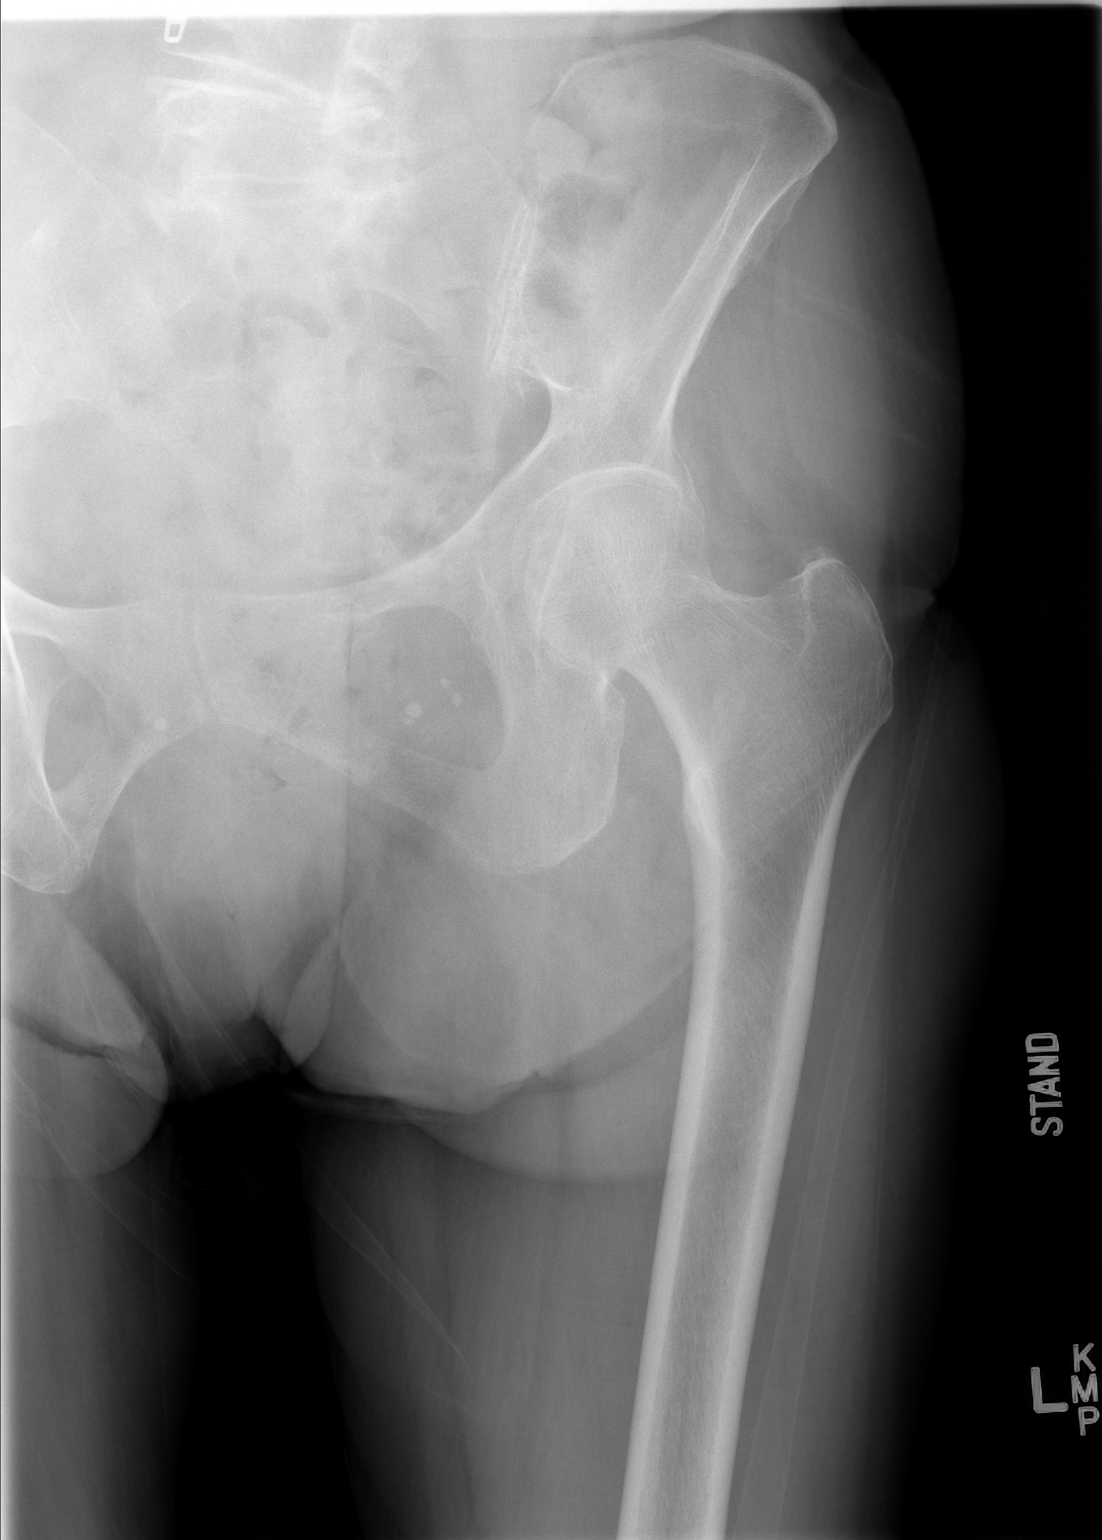

[t hip frog leg left]
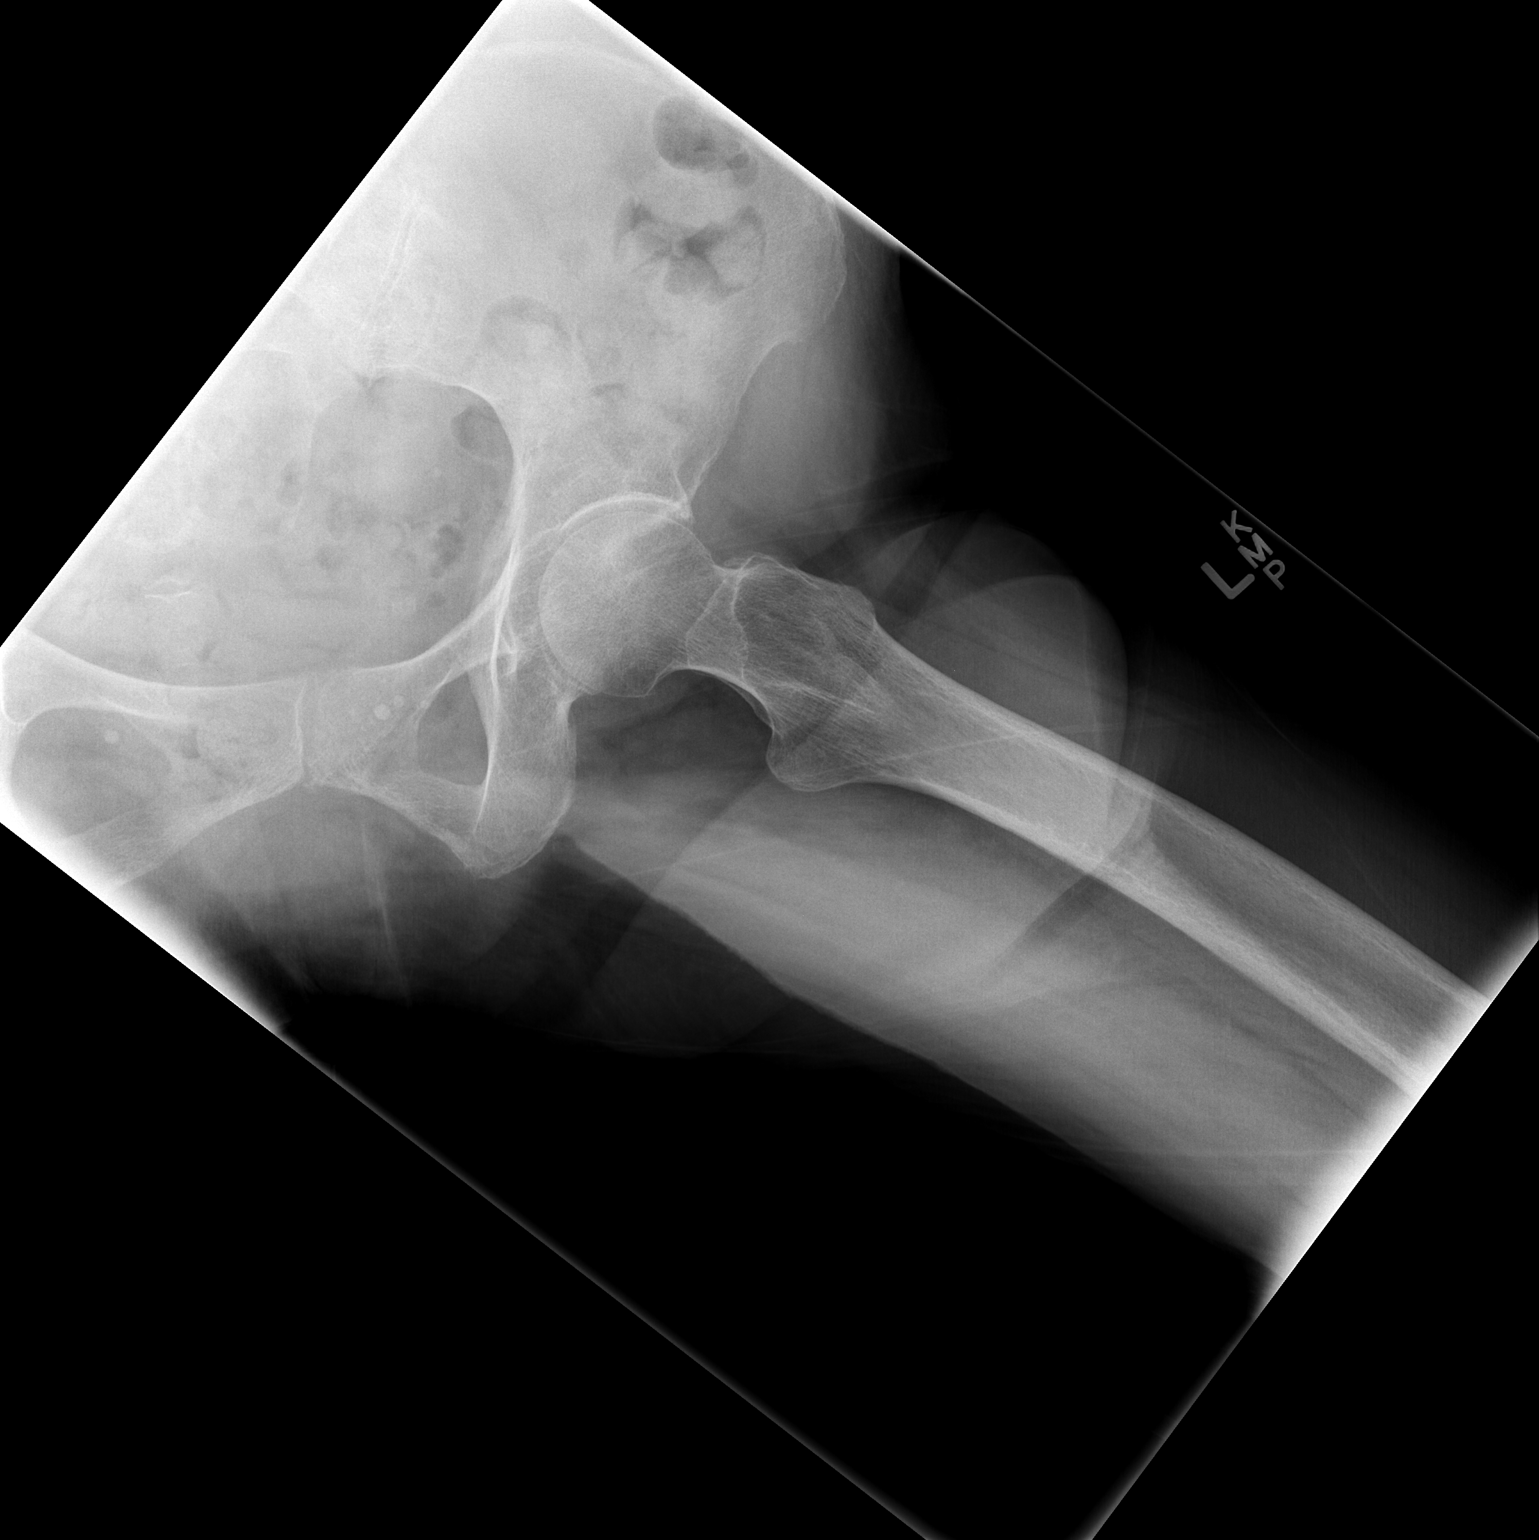

[2 of 2 positions shown; findings below may reference images not displayed]

FINDINGS: No fracture. No bone lesion. Left hip joint is normally spaced and
aligned with no arthropathic change. Bones are demineralized.

Soft tissues are unremarkable.
IMPRESSION: No fracture or left hip joint abnormality.

## 2018-03-27 ENCOUNTER — Ambulatory Visit (HOSPITAL_COMMUNITY)
Admission: EM | Admit: 2018-03-27 | Discharge: 2018-03-27 | Disposition: A | Discharge status: Home / Self Care | Payer: Medicare Other | Attending: Family Medicine | Admitting: Family Medicine

## 2018-03-27 ENCOUNTER — Other Ambulatory Visit: Payer: Self-pay

## 2018-03-27 ENCOUNTER — Telehealth (HOSPITAL_COMMUNITY): Payer: Self-pay | Admitting: *Deleted

## 2018-03-27 ENCOUNTER — Encounter (HOSPITAL_COMMUNITY): Payer: Self-pay | Admitting: *Deleted

## 2018-03-27 DIAGNOSIS — I10 Essential (primary) hypertension: Secondary | ICD-10-CM | POA: Insufficient documentation

## 2018-03-27 DIAGNOSIS — R350 Frequency of micturition: Secondary | ICD-10-CM | POA: Insufficient documentation

## 2018-03-27 DIAGNOSIS — F0390 Unspecified dementia without behavioral disturbance: Secondary | ICD-10-CM | POA: Insufficient documentation

## 2018-03-27 DIAGNOSIS — Z79899 Other long term (current) drug therapy: Secondary | ICD-10-CM | POA: Diagnosis not present

## 2018-03-27 LAB — POCT URINALYSIS DIP (DEVICE)
Bilirubin Urine: NEGATIVE
GLUCOSE, UA: NEGATIVE mg/dL
Hgb urine dipstick: NEGATIVE
Ketones, ur: NEGATIVE mg/dL
Nitrite: NEGATIVE
PROTEIN: NEGATIVE mg/dL
Specific Gravity, Urine: 1.01 (ref 1.005–1.030)
UROBILINOGEN UA: 0.2 mg/dL (ref 0.0–1.0)
pH: 6 (ref 5.0–8.0)

## 2018-03-27 LAB — POCT I-STAT, CHEM 8
BUN: 25 mg/dL — ABNORMAL HIGH (ref 6–20)
CALCIUM ION: 1.43 mmol/L — AB (ref 1.15–1.40)
CREATININE: 1.1 mg/dL — AB (ref 0.44–1.00)
Chloride: 105 mmol/L (ref 101–111)
GLUCOSE: 110 mg/dL — AB (ref 65–99)
HCT: 38 % (ref 36.0–46.0)
Hemoglobin: 12.9 g/dL (ref 12.0–15.0)
Potassium: 4.1 mmol/L (ref 3.5–5.1)
Sodium: 143 mmol/L (ref 135–145)
TCO2: 28 mmol/L (ref 22–32)

## 2018-03-27 MED ORDER — CEPHALEXIN 500 MG PO CAPS: 500.0000 mg | ORAL_CAPSULE | Freq: Three times a day (TID) | ORAL | 0 refills | Status: AC

## 2018-03-27 NOTE — Discharge Instructions (Signed)
There is some concern for possible UTI with sample today. I have sent this to be cultured to confirm.  We will call with any changes indicated by culture.  Please start course of antibiotics. Please follow up with primary care provider for recheck in the next week. If worsening, fevers, confusion, weakness or otherwise worsening please go to Er.

## 2018-03-27 NOTE — ED Triage Notes (Signed)
Pt c/o urinary frequency, pain and burning with urination, and vaginal itching.

## 2018-03-27 NOTE — ED Provider Notes (Signed)
[facility]    CSN: [id number] Arrival date & time: 03/27/18  1415     History   Chief Complaint Chief Complaint  Patient presents with  . Urinary Frequency    HPI Mandy Smith is a 84 y.o. female.   Mandy Smith presents with her daughter with complaints of urinary frequency which has been ongoing for the past 1-2 weeks. Daughter states  Mandy Smith has had some increase in her dementia and has complained of burning and possibly itching with urination. Urinary frequency present. No fevers. No abdominal or back pain, no other complaints of pain. She has had decreased intake of fluids and food recently. She resides alone but family nearby. Daughter who brings her in resides in [place]. Mandy Smith does have a PCP she follows with regularly. No headache, dizziness, chest pain  Or palpitations. Takes medication for htn and that's all.    ROS per HPI.      Past Medical History:  Diagnosis Date  . Hypertension     There are no active problems to display for this patient.   Past Surgical History:  Procedure Laterality Date  . BACK SURGERY      OB History   None      Home Medications    Prior to Admission medications   Medication Sig Start Date End Date Taking? Authorizing Provider  amLODipine (NORVASC) 2.5 MG tablet Take 2.5 mg by mouth daily. Patient unsure of dose   Yes Emergency, Nurse, RN  lisinopril-hydrochlorothiazide (PRINZIDE,ZESTORETIC) 10-12.5 MG per tablet Take 1 tablet by mouth daily.   Yes [provider]  cephALEXin (KEFLEX) 500 MG capsule Take 1 capsule (500 mg total) by mouth 3 (three) times daily. 03/27/18   Georgetta HaberBurky, Dvora Buitron B, NP    Family History History reviewed. No pertinent family history.  Social History Social History   Tobacco Use  . Smoking status: Never Smoker  . Smokeless tobacco: Never Used  Substance Use Topics  . Alcohol use: No  . Drug use: No     Allergies   Patient has no known allergies.   Review of Systems Review  of Systems   Physical Exam Triage Vital Signs ED Triage Vitals  Enc Vitals Group     BP 03/27/18 1523 (!) 147/53     Pulse Rate 03/27/18 1523 65     Resp 03/27/18 1523 20     Temp 03/27/18 1523 98.7 F (37.1 C)     Temp Source 03/27/18 1523 Temporal     SpO2 03/27/18 1523 100 %     Weight 03/27/18 1525 102 lb 12.8 oz (46.6 kg)     Height 03/27/18 1525 5\' 5"  (1.651 m)     Head Circumference --      Peak Flow --      Pain Score 03/27/18 1524 0     Pain Loc --      Pain Edu? --      Excl. in GC? --    No data found.  Updated Vital Signs BP (!) 147/53 (BP Location: Left Arm)   Pulse 65   Temp 98.7 F (37.1 C) (Temporal)   Resp 20   Ht 5\' 5"  (1.651 m)   Wt 102 lb 12.8 oz (46.6 kg)   SpO2 100%   BMI 17.11 kg/m     Physical Exam  Constitutional: She appears well-developed and well-nourished. No distress.  Cardiovascular: Normal rate, regular rhythm and normal heart sounds.  Pulmonary/Chest: Effort normal and breath sounds normal.  Abdominal: Soft. There is no tenderness. There is no rigidity, no rebound, no guarding and no CVA tenderness.  Neurological: She is alert.  Patient recall of symptoms changes throughout exam; history primarily received from daughter   Skin: Skin is warm and dry.     UC Treatments / Results  Labs (all labs ordered are listed, but only abnormal results are displayed) Labs Reviewed  POCT URINALYSIS DIP (DEVICE) - Abnormal; Notable for the following components:      Result Value   Leukocytes, UA MODERATE (*)    All other components within normal limits  POCT I-STAT, CHEM 8 - Abnormal; Notable for the following components:   BUN 25 (*)    Creatinine, Ser 1.10 (*)    Glucose, Bld 110 (*)    Calcium, Ion 1.43 (*)    All other components within normal limits  URINE CULTURE  URINE CYTOLOGY ANCILLARY ONLY    EKG None  Radiology No results found.  Procedures Procedures (including critical care time)  Medications Ordered in  UC Medications - No data to display  Initial Impression / Assessment and Plan / UC Course  I have reviewed the triage vital signs and the nursing notes.  Pertinent labs & imaging results that were available during my care of the patient were reviewed by me and considered in my medical decision making (see chart for details).     Daughter has noticed urinary symptoms with some increase from baseline in dementia symptoms. Urine with leuks present. Culture obtained. Very slight elevation in creatine at 1.1. Will treat with antibiotics at this time, close follow up- recheck with PCP this week. Janice Coffin Patient's daughter verbalized understanding and agreeable to plan.    Final Clinical Impressions(s) / UC Diagnoses   Final diagnoses:  Urinary frequency     Discharge Instructions     There is some concern for possible UTI with sample today. I have sent this to be cultured to confirm.  We will call with any changes indicated by culture.  Please start course of antibiotics. Please follow up with primary care provider for recheck in the next week. If worsening, fevers, confusion, weakness or otherwise worsening please go to Er.     ED Prescriptions    Medication Sig Dispense Auth. Provider   cephALEXin (KEFLEX) 500 MG capsule Take 1 capsule (500 mg total) by mouth 3 (three) times daily. 30 capsule Georgetta Haber, NP     Controlled Substance Prescriptions [place] Controlled Substance Registry consulted? Not Applicable   Georgetta Haber, NP 03/27/18 1622

## 2018-03-27 NOTE — Telephone Encounter (Signed)
Per pharmacist, pt got Keflex Rx filled, then left and lost it.  Requesting auth for another Rx.  OK per Dr Lum BabeEniola.

## 2018-03-29 LAB — URINE CULTURE

## 2018-03-30 ENCOUNTER — Telehealth (HOSPITAL_COMMUNITY): Payer: Self-pay

## 2018-03-30 NOTE — Telephone Encounter (Signed)
Urine culture positive for E.coli, this was treated with keflex at urgent care visit.  Pt contacted and made aware.

## 2018-03-31 LAB — URINE CYTOLOGY ANCILLARY ONLY
BACTERIAL VAGINITIS: NEGATIVE
Candida vaginitis: NEGATIVE

## 2023-11-14 DEATH — deceased
